# Patient Record
Sex: Male | Born: 2000 | Race: Black or African American | Hispanic: No | Marital: Single | Smoking: Never smoker
Health system: Southern US, Community
[De-identification: ages and names within clinical notes are randomized; demographics above are authoritative.]

## PROBLEM LIST (undated history)

## (undated) DIAGNOSIS — J45909 Unspecified asthma, uncomplicated: Secondary | ICD-10-CM

---
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
## 2021-01-21 ENCOUNTER — Other Ambulatory Visit: Payer: Self-pay

## 2021-01-21 ENCOUNTER — Emergency Department (HOSPITAL_COMMUNITY)
Admission: EM | Admit: 2021-01-21 | Discharge: 2021-01-22 | Disposition: A | Discharge status: Home / Self Care | Payer: Self-pay | Attending: Emergency Medicine | Admitting: Emergency Medicine

## 2021-01-21 ENCOUNTER — Encounter (HOSPITAL_COMMUNITY): Payer: Self-pay | Admitting: *Deleted

## 2021-01-21 DIAGNOSIS — R0602 Shortness of breath: Secondary | ICD-10-CM | POA: Insufficient documentation

## 2021-01-21 DIAGNOSIS — J45909 Unspecified asthma, uncomplicated: Secondary | ICD-10-CM | POA: Insufficient documentation

## 2021-01-21 DIAGNOSIS — Z7722 Contact with and (suspected) exposure to environmental tobacco smoke (acute) (chronic): Secondary | ICD-10-CM | POA: Insufficient documentation

## 2021-01-21 DIAGNOSIS — R079 Chest pain, unspecified: Secondary | ICD-10-CM | POA: Insufficient documentation

## 2021-01-21 NOTE — ED Triage Notes (Signed)
Pt c/o chest pain for 2 hours with some diff breathing
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
## 2021-01-22 ENCOUNTER — Emergency Department (HOSPITAL_COMMUNITY): Payer: Self-pay

## 2021-01-22 LAB — BASIC METABOLIC PANEL
Anion gap: 9 (ref 5–15)
BUN: 11 mg/dL (ref 6–20)
CO2: 25 mmol/L (ref 22–32)
Calcium: 9.5 mg/dL (ref 8.9–10.3)
Chloride: 102 mmol/L (ref 98–111)
Creatinine, Ser: 0.91 mg/dL (ref 0.61–1.24)
GFR, Estimated: >60 mL/min (ref >60)
Glucose, Bld: 114 mg/dL — ABNORMAL HIGH (ref 70–99)
Potassium: 3.4 mmol/L — ABNORMAL LOW (ref 3.5–5.1)
Sodium: 136 mmol/L (ref 135–145)

## 2021-01-22 LAB — CBC
HCT: 38.5 % — ABNORMAL LOW (ref 39.0–52.0)
Hemoglobin: 12.8 g/dL — ABNORMAL LOW (ref 13.0–17.0)
MCH: 29.9 pg (ref 26.0–34.0)
MCHC: 33.2 g/dL (ref 30.0–36.0)
MCV: 90 fL (ref 80.0–100.0)
Platelets: 345 10*3/uL (ref 150–400)
RBC: 4.28 MIL/uL (ref 4.22–5.81)
RDW: 11.8 % (ref 11.5–15.5)
WBC: 10.7 10*3/uL — ABNORMAL HIGH (ref 4.0–10.5)
nRBC: 0 % (ref 0.0–0.2)

## 2021-01-22 LAB — TROPONIN I (HIGH SENSITIVITY)
Troponin I (High Sensitivity): 3 ng/L (ref <18)
Troponin I (High Sensitivity): 4 ng/L (ref <18)

## 2021-01-22 NOTE — ED Provider Notes (Signed)
[facility] EMERGENCY DEPARTMENT Provider Note   CSN: [id number] Arrival date & time: 01/21/21  2320     History Chief Complaint  Patient presents with   Chest Pain    Curtis Potts is a 20 y.o. male.  HPI 20 year old male with history of asthma presents to the ER with complaints of chest pain and shortness of breath.  He states that he was laying in bed and started to feel chest tightness, and then started to feel short of breath.  Symptoms have resolved since being in the ER.  Has a history of asthma but states that this does not feel like his asthma.  Denies pleuritic symptoms, syncope, recent travel.    Past Medical History:  Diagnosis Date   Asthma     There are no problems to display for this patient.   History reviewed. No pertinent surgical history.     No family history on file.  Social History   Tobacco Use   Smoking status: Passive Smoke Exposure - Never Smoker   Smokeless tobacco: Never    Home Medications Prior to Admission medications   Medication Sig Start Date End Date Taking? Authorizing Provider  albuterol (PROVENTIL HFA;VENTOLIN HFA) 108 (90 BASE) MCG/ACT inhaler Inhale 2 puffs into the lungs every 6 (six) hours as needed. For shortness of breath    [provider]  Cetirizine HCl (ZYRTEC) 5 MG/5ML SYRP Take 5 mg by mouth daily as needed. For allergies    [provider]    Allergies    Patient has no known allergies.  Review of Systems   Review of Systems Ten systems reviewed and are negative for acute change, except as noted in the HPI.   Physical Exam Updated Vital Signs BP (!) 164/81   Pulse 95   Temp (!) 97.5 F (36.4 C)   Resp 11   Ht 5\' 8"  (1.727 m)   Wt 113.4 kg   SpO2 100%   BMI 38.01 kg/m   Physical Exam Vitals and nursing note reviewed.  Constitutional:      Appearance: He is well-developed. He is not ill-appearing or diaphoretic.  HENT:     Head: Normocephalic and  atraumatic.  Eyes:     Conjunctiva/sclera: Conjunctivae normal.  Cardiovascular:     Rate and Rhythm: Normal rate and regular rhythm.     Heart sounds: Normal heart sounds. No murmur heard. Pulmonary:     Effort: Pulmonary effort is normal. No respiratory distress.     Breath sounds: Normal breath sounds. No decreased breath sounds or wheezing.  Chest:     Chest wall: No tenderness.  Abdominal:     Palpations: Abdomen is soft.     Tenderness: There is no abdominal tenderness.  Musculoskeletal:        General: Normal range of motion.     Cervical back: Neck supple.     Right lower leg: No edema.     Left lower leg: No edema.  Skin:    General: Skin is warm and dry.  Neurological:     Mental Status: He is alert.    ED Results / Procedures / Treatments   Labs (all labs ordered are listed, but only abnormal results are displayed) Labs Reviewed  CBC - Abnormal; Notable for the following components:      Result Value   WBC 10.7 (*)    Hemoglobin 12.8 (*)    HCT 38.5 (*)    All other components within  normal limits  BASIC METABOLIC PANEL - Abnormal; Notable for the following components:   Potassium 3.4 (*)    Glucose, Bld 114 (*)    All other components within normal limits  TROPONIN I (HIGH SENSITIVITY)  TROPONIN I (HIGH SENSITIVITY)    EKG EKG Interpretation  Date/Time:  Friday January 21 2021 23:46:51 EDT Ventricular Rate:  75 PR Interval:  134 QRS Duration: 92 QT Interval:  348 QTC Calculation: 388 R Axis:   65 Text Interpretation: Sinus rhythm with sinus arrhythmia with occasional Premature ventricular complexes poor data quality limits interpretation. no obvious ischemia Confirmed by Pricilla Loveless [phone]) on 01/22/2021 3:05:25 AM  Radiology DG Chest 2 View  Result Date: 01/22/2021 CLINICAL DATA:  Chest pain. EXAM: CHEST - 2 VIEW COMPARISON:  August 01, 2008 FINDINGS: The heart size and mediastinal contours are within normal limits. Both lungs are clear. The  visualized skeletal structures are unremarkable. IMPRESSION: No active cardiopulmonary disease. Electronically Signed   By: Aram Candela M.D.   On: 01/22/2021 00:16    Procedures Procedures   Medications Ordered in ED Medications - No data to display  ED Course  I have reviewed the triage vital signs and the nursing notes.  Pertinent labs & imaging results that were available during my care of the patient were reviewed by me and considered in my medical decision making (see chart for details).    MDM Rules/Calculators/A&P                           20 year old presenting with chest pain and shortness of breath.  On arrival, he is well-appearing, no acute distress, resting comfortably in the ER bed.  No reproducible chest wall tenderness.  Lung sounds are clear.  Speaking full sentences without increased work of breathing, no wheezing.  Chest pain work-up overall benign, delta troponins negative, EKG nonischemic, chest x-ray without any evidence of pneumonia.  CBC and BMP largely unremarkable, borderline leukocytosis of 10.7.  Delta troponins negative. Suspect GERD versus anxiety.  Encouraged taking albuterol inhaler. Encouraged PCP followup. Discussed return precautions. Stable for discharge.  Final Clinical Impression(s) / ED Diagnoses Final diagnoses:  Chest pain, unspecified type    Rx / DC Orders ED Discharge Orders     None        Mare Ferrari, PA-C 01/22/21 9450    Pricilla Loveless, MD 01/22/21 [phone]

## 2021-01-22 NOTE — Discharge Instructions (Addendum)
You were evaluated in the Emergency Department and after careful evaluation, we did not find any emergent condition requiring admission or further testing in the hospital.  Please take your inhaler for your shortness of breath   Please return to the Emergency Department if you experience any worsening of your condition.  We encourage you to follow up with a primary care provider.  Thank you for allowing [place] to be a part of your care.

## 2021-07-22 ENCOUNTER — Ambulatory Visit (INDEPENDENT_AMBULATORY_CARE_PROVIDER_SITE_OTHER): Payer: Managed Care, Other (non HMO) | Admitting: Family Medicine

## 2021-07-22 ENCOUNTER — Encounter: Payer: Self-pay | Admitting: Family Medicine

## 2021-07-22 VITALS — BP 116/76 | HR 60 | Temp 97.8°F | Ht 66.5 in | Wt 227.0 lb

## 2021-07-22 DIAGNOSIS — K219 Gastro-esophageal reflux disease without esophagitis: Secondary | ICD-10-CM | POA: Diagnosis not present

## 2021-07-22 DIAGNOSIS — R21 Rash and other nonspecific skin eruption: Secondary | ICD-10-CM

## 2021-07-22 DIAGNOSIS — J302 Other seasonal allergic rhinitis: Secondary | ICD-10-CM

## 2021-07-22 DIAGNOSIS — J4520 Mild intermittent asthma, uncomplicated: Secondary | ICD-10-CM | POA: Diagnosis not present

## 2021-07-22 DIAGNOSIS — E669 Obesity, unspecified: Secondary | ICD-10-CM

## 2021-07-22 DIAGNOSIS — Z7689 Persons encountering health services in other specified circumstances: Secondary | ICD-10-CM

## 2021-07-22 MED ORDER — CETIRIZINE HCL 10 MG PO TABS: 10.0000 mg | ORAL_TABLET | Freq: Every day | ORAL | 5 refills | Status: AC

## 2021-07-22 MED ORDER — TRIAMCINOLONE ACETONIDE 0.1 % EX CREA: 1.0000 "application " | TOPICAL_CREAM | Freq: Two times a day (BID) | CUTANEOUS | 0 refills | Status: DC

## 2021-07-22 MED ORDER — FLUTICASONE PROPIONATE 50 MCG/ACT NA SUSP: 2.0000 | Freq: Every day | NASAL | 0 refills | Status: DC

## 2021-07-22 MED ORDER — FAMOTIDINE 20 MG PO TABS: 20.0000 mg | ORAL_TABLET | Freq: Two times a day (BID) | ORAL | 0 refills | Status: DC

## 2021-07-22 MED ORDER — ALBUTEROL SULFATE HFA 108 (90 BASE) MCG/ACT IN AERS: 2.0000 | INHALATION_SPRAY | Freq: Four times a day (QID) | RESPIRATORY_TRACT | 0 refills | Status: AC | PRN

## 2021-07-22 NOTE — Patient Instructions (Addendum)
Dermatology offices ? ?[facility]: Phone #: [phone] ?Address: [address], [place], [place] [address] ? ?[facility]: Phone: [phone] ? Address: [address], [place], [place] [address] ? ?[facility]: [phone] ?Address: [address] [place] [address] ?[place], [place] [address] ? ?[facility] ?Address: [address] [place], [place], [place] [address] ?Phone: [phone]  ? ? ?Food Choices for Gastroesophageal Reflux Disease, Adult ?When you have gastroesophageal reflux disease (GERD), the foods you eat and your eating habits are very important. Choosing the right foods can help ease your discomfort. Think about working with a food expert (dietitian) to help you make good choices. ?What are tips for following this plan? ?Reading food labels ?Look for foods that are low in saturated fat. Foods that may help with your symptoms include: ?Foods that have less than 5% of daily value (DV) of fat. ?Foods that have 0 grams of trans fat. ?Cooking ?Do not fry your food. ?Cook your food by baking, steaming, grilling, or broiling. These are all methods that do not need a lot of fat for cooking. ?To add flavor, try to use herbs that are low in spice and acidity. ?Meal planning ? ?Choose healthy foods that are low in fat, such as: ?Fruits and vegetables. ?Whole grains. ?Low-fat dairy products. ?Lean meats, fish, and poultry. ?Eat small meals often instead of eating 3 large meals each day. Eat your meals slowly in a place where you are relaxed. Avoid bending over or lying down until 2-3 hours after eating. ?Limit high-fat foods such as fatty meats or fried foods. ?Limit your intake of fatty foods, such as oils, butter, and shortening. ?Avoid the following as told by your doctor: ?Foods that cause symptoms. These may be different for different people. Keep a food diary to keep track of foods that cause symptoms. ?Alcohol. ?Drinking a lot of liquid with meals. ?Eating meals during  the 2-3 hours before bed. ?Lifestyle ?Stay at a healthy weight. Ask your doctor what weight is healthy for you. If you need to lose weight, work with your doctor to do so safely. ?Exercise for at least 30 minutes on 5 or more days each week, or as told by your doctor. ?Wear loose-fitting clothes. ?Do not smoke or use any products that contain nicotine or tobacco. If you need help quitting, ask your doctor. ?Sleep with the head of your bed higher than your feet. Use a wedge under the mattress or blocks under the bed frame to raise the head of the bed. ?Chew sugar-free gum after meals. ?What foods should eat? ?Eat a healthy, well-balanced diet of fruits, vegetables, whole grains, low-fat dairy products, lean meats, fish, and poultry. Each person is different. ?Foods that may cause symptoms in one person may not cause any symptoms in another person. Work with your doctor to find foods that are safe for you. ?The items listed above may not be a complete list of what you can eat and drink. Contact a food expert for more options. ?What foods should I avoid? ?Limiting some of these foods may help in managing the symptoms of GERD. Everyone is different. Talk with a food expert or your doctor to help you find the exact foods to avoid, if any. ?Fruits ?Any fruits prepared with added fat. Any fruits that cause symptoms. For some people, this may include citrus fruits, such as oranges, grapefruit, pineapple, and lemons. ?Vegetables ?Deep-fried vegetables. [place] fries. Any vegetables prepared with added fat. Any vegetables that cause symptoms. For some people, this  may include tomatoes and tomato products, chili peppers, onions and garlic, and horseradish. ?Grains ?Pastries or quick breads with added fat. ?Meats and other proteins ?High-fat meats, such as fatty beef or pork, hot dogs, ribs, ham, sausage, salami, and bacon. Fried meat or protein, including fried fish and fried chicken. Nuts and nut butters, in large  amounts. ?Dairy ?Whole milk and chocolate milk. Sour cream. Cream. Ice cream. Cream cheese. Milkshakes. ?Fats and oils ?Butter. Margarine. Shortening. Ghee. ?Beverages ?Coffee and tea, with or without caffeine. Carbonated beverages. Sodas. Energy drinks. Fruit juice made with acidic fruits, such as orange or grapefruit. Tomato juice. Alcoholic drinks. ?Sweets and desserts ?Chocolate and cocoa. Donuts. ?Seasonings and condiments ?Pepper. Peppermint and spearmint. Added salt. Any condiments, herbs, or seasonings that cause symptoms. For some people, this may include curry, hot sauce, or vinegar-based salad dressings. ?The items listed above may not be a complete list of what you should not eat and drink. Contact a food expert for more options. ?Questions to ask your doctor ?Diet and lifestyle changes are often the first steps that are taken to manage symptoms of GERD. If diet and lifestyle changes do not help, talk with your doctor about taking medicines. ?Where to find more information ?International Foundation for Gastrointestinal Disorders: aboutgerd.org ?Summary ?When you have GERD, food and lifestyle choices are very important in easing your symptoms. ?Eat small meals often instead of 3 large meals a day. Eat your meals slowly and in a place where you are relaxed. ?Avoid bending over or lying down until 2-3 hours after eating. ?Limit high-fat foods such as fatty meats or fried foods. ?This information is not intended to replace advice given to you by your health care provider. Make sure you discuss any questions you have with your health care provider. ?Document Revised: 10/20/2019 Document Reviewed: 10/20/2019 ?Elsevier Patient Education ? 2022 Elsevier Inc. ? ?

## 2021-07-22 NOTE — Progress Notes (Signed)
? ?Subjective:  ? ? Patient ID: Curtis Potts, male    DOB: 2001/01/22, 21 y.o.   MRN: [id number] ? ?HPI ?Chief Complaint  ?Patient presents with  ? Establish Care  ?  Would like to discuss his allergies, needs new medication for zyrtec. GERD has been messing with him a lot which he would like to discuss as well.   ? ?He is new to the practice and here to establish care.  ?Previous medical care: ?[facility]  ? ?States his allergies are acting up. Seasonal allergies. Sneezing, nasal congestion, rhinorrhea.  ?Normally he takes Zyrtec but is out and did not realize he could purchase it OTC.  ? ?Asthma related to exercise.  ?Needs refill of albuterol. No recent flare ups.  ?Has not been exercising.  ? ?States he is gaining weight and wants to exercise.  ? ?Rash on his bilateral upper arms for years. Pruritic and would like to get bumps removed. Has not seen derm.  ?Denies rash elsewhere.  ? ?No arthralgias or myalgias.  ? ?Denies fever, chills, dizziness, chest pain, palpitations, shortness of breath, abdominal pain, N/V/D, or urinary symptoms.  ? ? ?Social history: Lives with grandmother ?Denies smoking, drinking alcohol, drug use ? ? ? ?Reviewed allergies, medications, past medical, surgical, family, and social history. ? ? ? ? ?Review of Systems ?Pertinent positives and negatives in the history of present illness. ? ?   ?Objective:  ? Physical Exam ?Constitutional:   ?   General: He is not in acute distress. ?   Appearance: Normal appearance. He is not ill-appearing.  ?HENT:  ?   Nose: Congestion and rhinorrhea present.  ?Eyes:  ?   Conjunctiva/sclera: Conjunctivae normal.  ?   Pupils: Pupils are equal, round, and reactive to light.  ?Cardiovascular:  ?   Rate and Rhythm: Normal rate and regular rhythm.  ?   Pulses: Normal pulses.  ?Pulmonary:  ?   Effort: Pulmonary effort is normal.  ?   Breath sounds: Normal breath sounds.  ?Musculoskeletal:  ?   Cervical back: Normal range of motion and neck  supple.  ?Skin: ?   General: Skin is warm and dry.  ?   Findings: Rash present.  ?   Comments: Small, pinpoint, raised pink lesions on bilateral lateral upper arms. No surrounding erythema or drainage.   ?Neurological:  ?   General: No focal deficit present.  ?   Mental Status: He is alert and oriented to person, place, and time.  ?Psychiatric:     ?   Mood and Affect: Mood normal.     ?   Behavior: Behavior normal.  ? ?BP 116/76 (BP Location: Left Arm, Patient Position: Sitting, Cuff Size: Large)   Pulse 60   Temp 97.8 ?F (36.6 ?C) (Temporal)   Ht 5' 6.5" (1.689 m)   Wt 227 lb (103 kg)   SpO2 97%   BMI 36.09 kg/m?  ? ? ? ? ?   ?Assessment & Plan:  ?Gastroesophageal reflux disease, unspecified whether esophagitis present - Plan: famotidine (PEPCID) 20 MG tablet ?-Counseling done on lifestyle modifications for GERD management.  He may try Pepcid 1-2 times per day for the next month to see if that helps.  Discussed avoiding triggers. ? ?Encounter to establish care ? ?Mild intermittent asthma without complication - Plan: albuterol (VENTOLIN HFA) 108 (90 Base) MCG/ACT inhaler ?-no recent flares. Albuterol refilled.  Counseling on how to premedicate with albuterol prior to exercise since his fear of  an asthma flareup has kept him from exercising recently. ? ?Obesity (BMI 30-39.9) ?-Encouraged cutting back on carbohydrates and calories and increasing physical activity. ? ?Seasonal allergies - Plan: cetirizine (ZYRTEC) 10 MG tablet, fluticasone (FLONASE) 50 MCG/ACT nasal spray ?-He will start back on Zyrtec.  Discussed that if he runs out of his prescription or if it is cheaper to buy over-the-counter that he may do so.  Discussed trying Flonase for nasal congestion.  Follow-up if not improving or if getting worse. ? ?Rash - Plan: triamcinolone cream (KENALOG) 0.1 % ?-He will try a topical steroid for pruritus for 1-2 weeks. No sign of inflammatory process.   He will call and schedule a dermatologist ? ?

## 2021-08-18 ENCOUNTER — Other Ambulatory Visit: Payer: Self-pay | Admitting: Family Medicine

## 2021-08-18 DIAGNOSIS — K219 Gastro-esophageal reflux disease without esophagitis: Secondary | ICD-10-CM

## 2021-08-18 DIAGNOSIS — J302 Other seasonal allergic rhinitis: Secondary | ICD-10-CM

## 2022-11-06 ENCOUNTER — Telehealth: Payer: Self-pay

## 2022-11-06 NOTE — Telephone Encounter (Signed)
LVM for patient to call back [phone], or to call PCP office to schedule follow up apt. AS, CMA  

## 2023-04-05 IMAGING — CR DG CHEST 2V
2 series · 2 of 2 positions shown · non-contrast
Comparison: August 01, 2008

CLINICAL DATA: Chest pain.

EXAM:
CHEST - 2 VIEW

[chest pa]
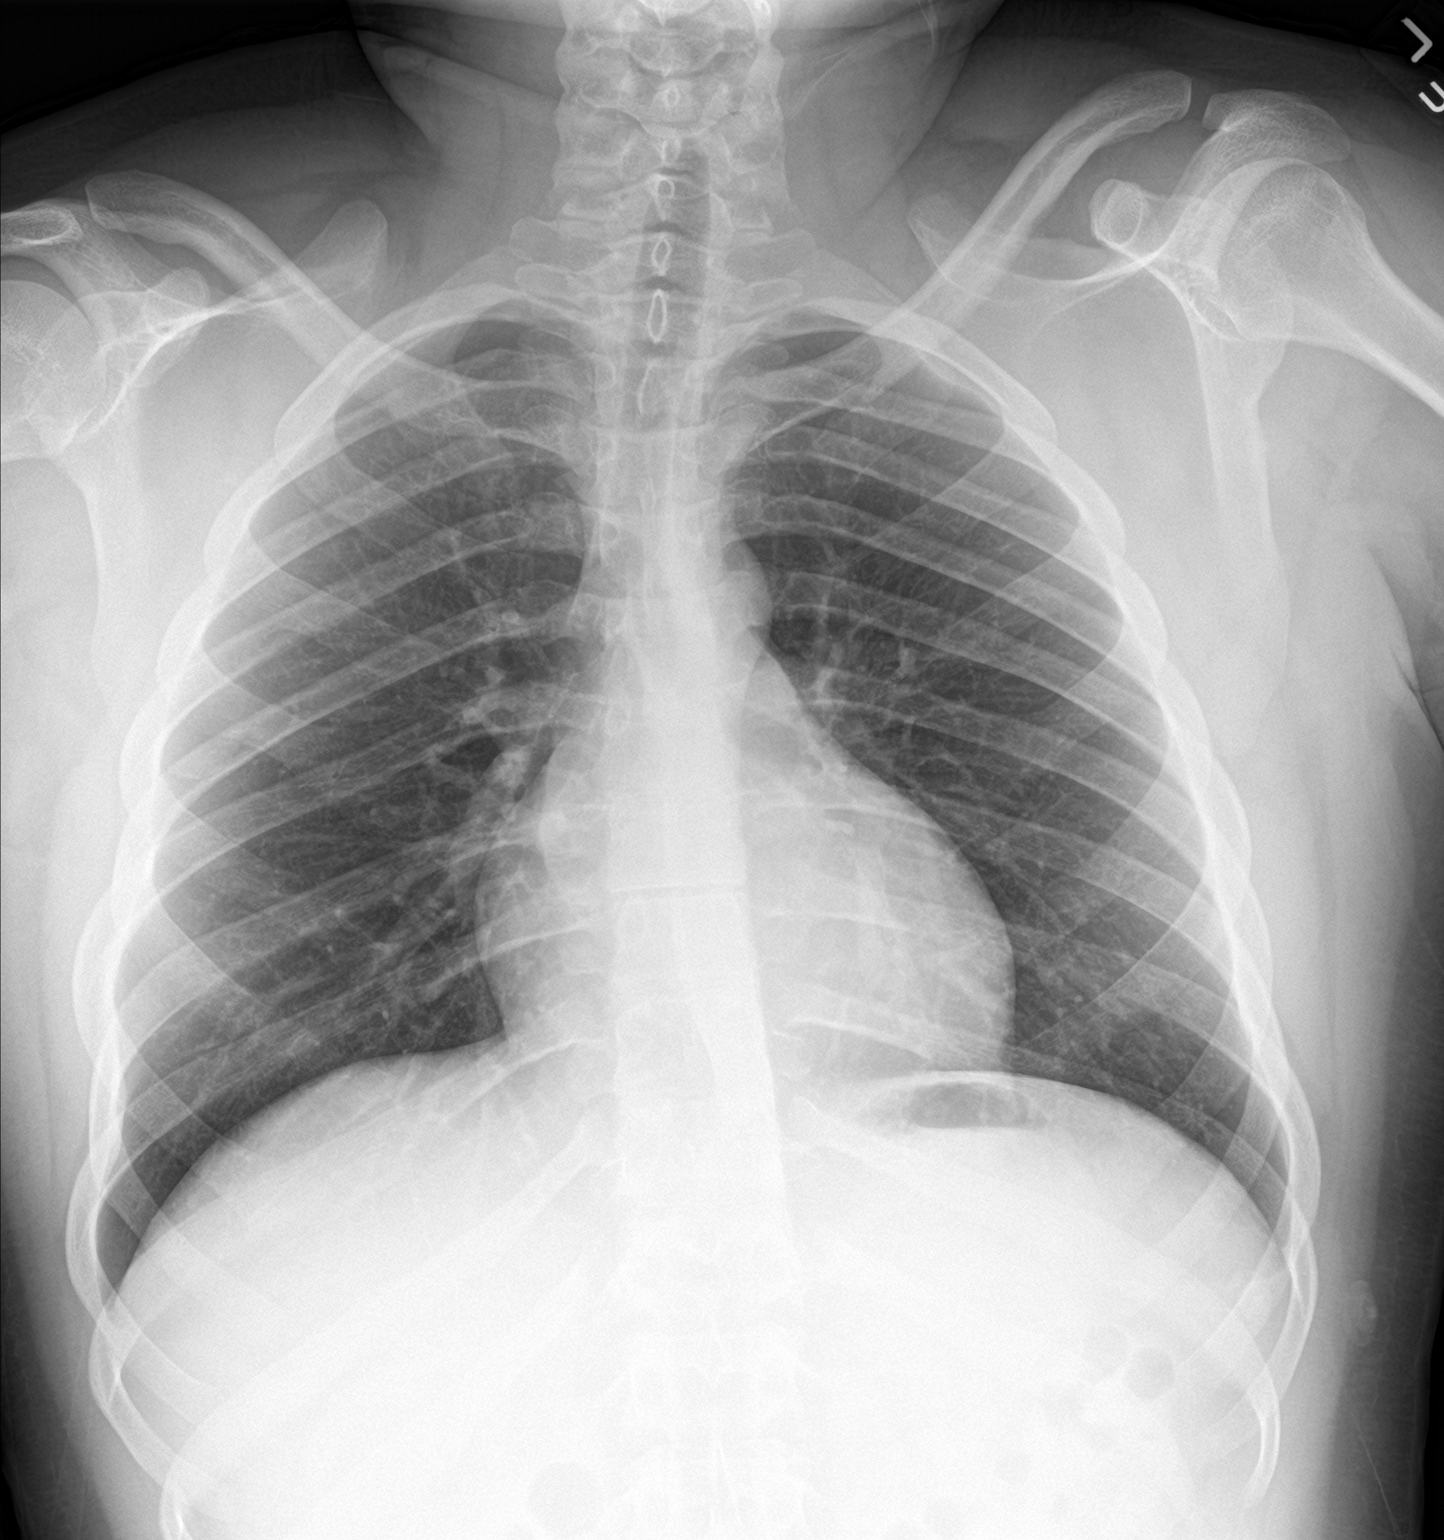

[chest lat]
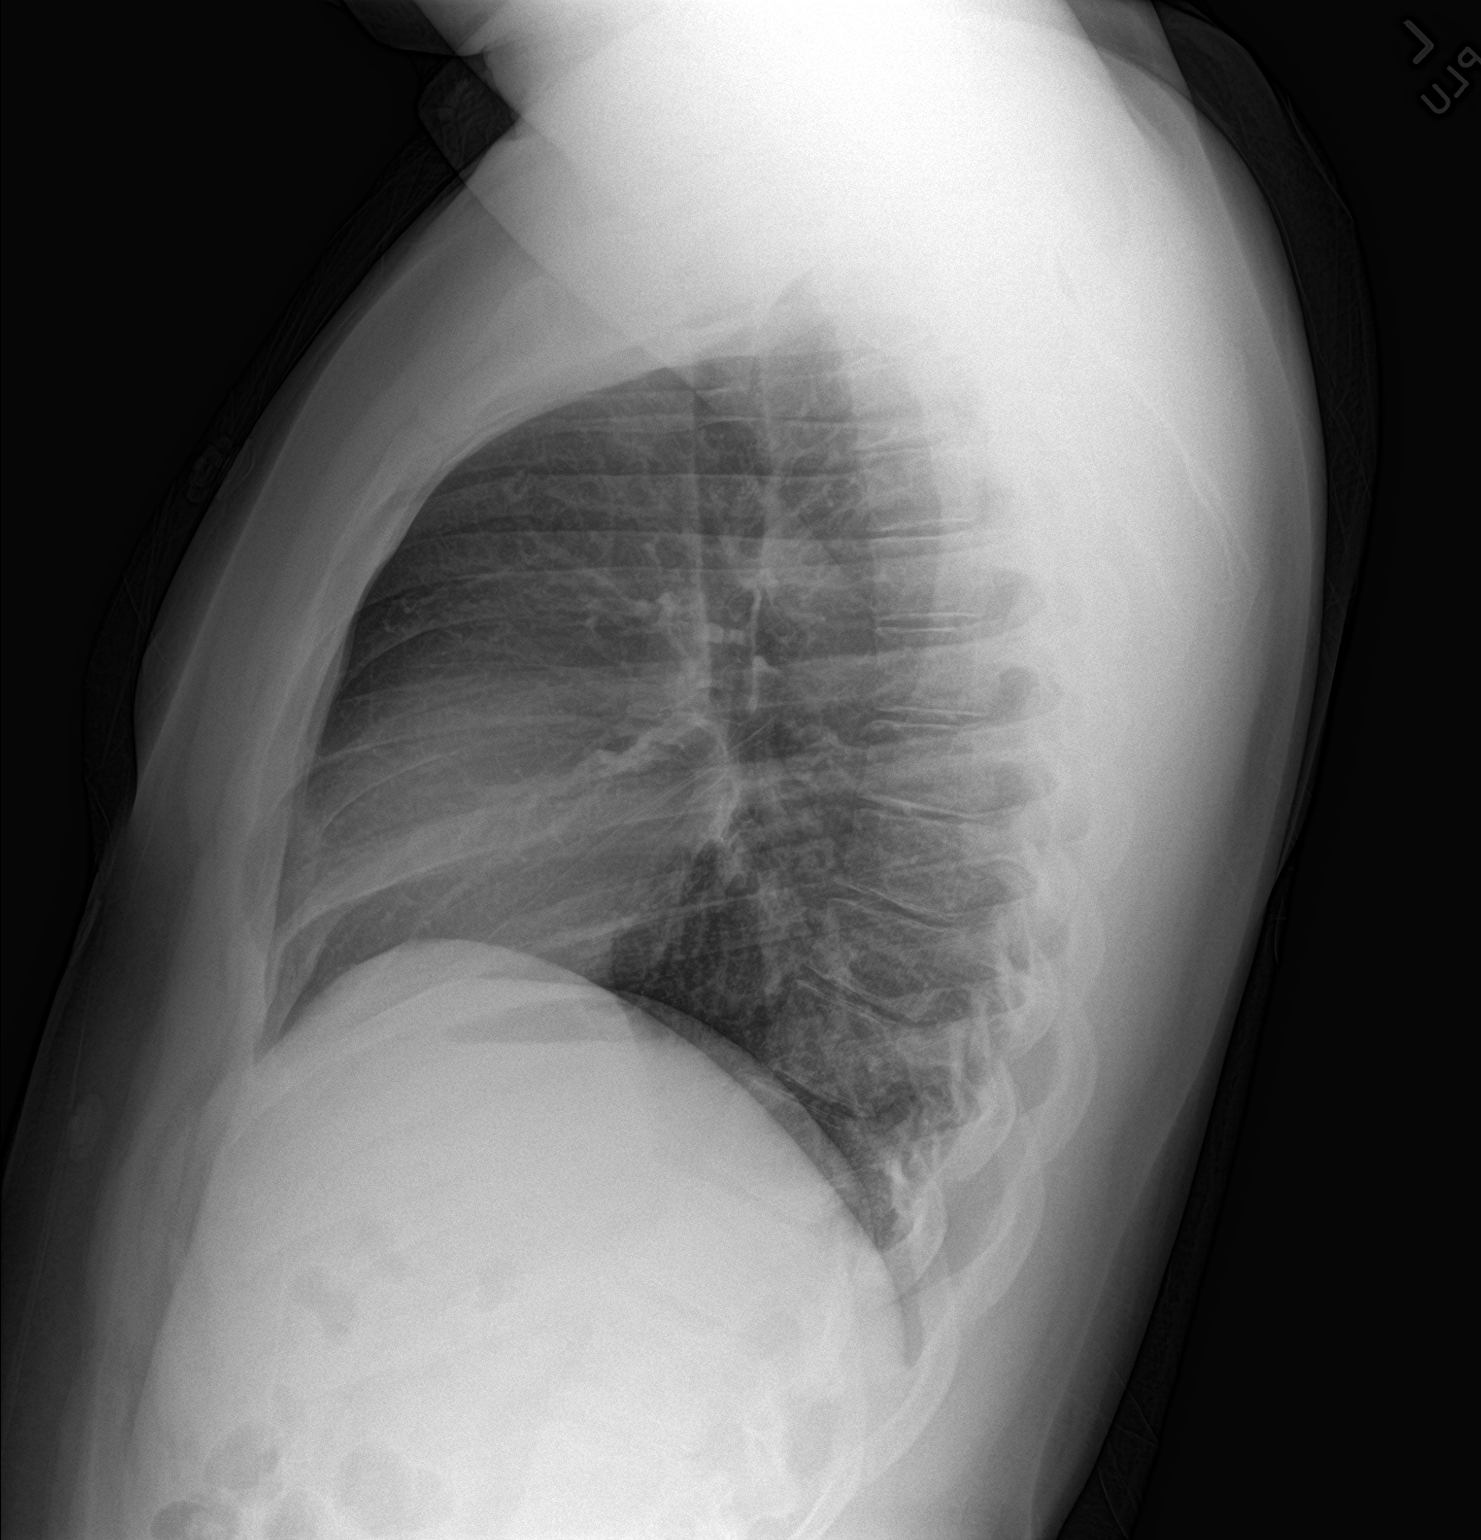

[2 of 2 positions shown; findings below may reference images not displayed]

FINDINGS: The heart size and mediastinal contours are within normal limits.
Both lungs are clear. The visualized skeletal structures are
unremarkable.
IMPRESSION: No active cardiopulmonary disease.

## 2024-01-09 ENCOUNTER — Other Ambulatory Visit: Payer: Self-pay

## 2024-01-09 ENCOUNTER — Emergency Department (HOSPITAL_COMMUNITY)
Admission: EM | Admit: 2024-01-09 | Discharge: 2024-01-10 | Disposition: A | Discharge status: Home / Self Care | Payer: MEDICAID | Attending: Emergency Medicine | Admitting: Emergency Medicine

## 2024-01-09 ENCOUNTER — Emergency Department (HOSPITAL_COMMUNITY): Payer: MEDICAID

## 2024-01-09 DIAGNOSIS — R1013 Epigastric pain: Secondary | ICD-10-CM | POA: Insufficient documentation

## 2024-01-09 DIAGNOSIS — D72829 Elevated white blood cell count, unspecified: Secondary | ICD-10-CM | POA: Insufficient documentation

## 2024-01-09 DIAGNOSIS — R42 Dizziness and giddiness: Secondary | ICD-10-CM | POA: Diagnosis not present

## 2024-01-09 DIAGNOSIS — Z7951 Long term (current) use of inhaled steroids: Secondary | ICD-10-CM | POA: Diagnosis not present

## 2024-01-09 DIAGNOSIS — K219 Gastro-esophageal reflux disease without esophagitis: Secondary | ICD-10-CM

## 2024-01-09 DIAGNOSIS — J45909 Unspecified asthma, uncomplicated: Secondary | ICD-10-CM | POA: Insufficient documentation

## 2024-01-09 DIAGNOSIS — R0602 Shortness of breath: Secondary | ICD-10-CM | POA: Insufficient documentation

## 2024-01-09 LAB — COMPREHENSIVE METABOLIC PANEL WITH GFR
ALT: 19 U/L (ref 0–44)
AST: 21 U/L (ref 15–41)
Albumin: 4.1 g/dL (ref 3.5–5.0)
Alkaline Phosphatase: 30 U/L — ABNORMAL LOW (ref 38–126)
Anion gap: 13 (ref 5–15)
BUN: 13 mg/dL (ref 6–20)
CO2: 23 mmol/L (ref 22–32)
Calcium: 9.7 mg/dL (ref 8.9–10.3)
Chloride: 103 mmol/L (ref 98–111)
Creatinine, Ser: 1.09 mg/dL (ref 0.61–1.24)
GFR, Estimated: >60 mL/min (ref >60)
Glucose, Bld: 96 mg/dL (ref 70–99)
Potassium: 4.2 mmol/L (ref 3.5–5.1)
Sodium: 139 mmol/L (ref 135–145)
Total Bilirubin: 1.5 mg/dL — ABNORMAL HIGH (ref 0.0–1.2)
Total Protein: 7.6 g/dL (ref 6.5–8.1)

## 2024-01-09 LAB — I-STAT CHEM 8, ED
BUN: 15 mg/dL (ref 6–20)
Calcium, Ion: 1.21 mmol/L (ref 1.15–1.40)
Chloride: 103 mmol/L (ref 98–111)
Creatinine, Ser: 1 mg/dL (ref 0.61–1.24)
Glucose, Bld: 101 mg/dL — ABNORMAL HIGH (ref 70–99)
HCT: 41 % (ref 39.0–52.0)
Hemoglobin: 13.9 g/dL (ref 13.0–17.0)
Potassium: 4.2 mmol/L (ref 3.5–5.1)
Sodium: 139 mmol/L (ref 135–145)
TCO2: 24 mmol/L (ref 22–32)

## 2024-01-09 LAB — CBC WITH DIFFERENTIAL/PLATELET
Abs Immature Granulocytes: 0.05 K/uL (ref 0.00–0.07)
Basophils Absolute: 0.1 K/uL (ref 0.0–0.1)
Basophils Relative: 0 %
Eosinophils Absolute: 0.1 K/uL (ref 0.0–0.5)
Eosinophils Relative: 0 %
HCT: 41.9 % (ref 39.0–52.0)
Hemoglobin: 14 g/dL (ref 13.0–17.0)
Immature Granulocytes: 0 %
Lymphocytes Relative: 14 %
Lymphs Abs: 2.3 K/uL (ref 0.7–4.0)
MCH: 30.4 pg (ref 26.0–34.0)
MCHC: 33.4 g/dL (ref 30.0–36.0)
MCV: 91.1 fL (ref 80.0–100.0)
Monocytes Absolute: 0.8 K/uL (ref 0.1–1.0)
Monocytes Relative: 5 %
Neutro Abs: 12.5 K/uL — ABNORMAL HIGH (ref 1.7–7.7)
Neutrophils Relative %: 81 %
Platelets: 370 K/uL (ref 150–400)
RBC: 4.6 MIL/uL (ref 4.22–5.81)
RDW: 11.9 % (ref 11.5–15.5)
WBC: 15.7 K/uL — ABNORMAL HIGH (ref 4.0–10.5)
nRBC: 0 % (ref 0.0–0.2)

## 2024-01-09 LAB — URINALYSIS, ROUTINE W REFLEX MICROSCOPIC
Bacteria, UA: NONE SEEN
Bilirubin Urine: NEGATIVE
Glucose, UA: NEGATIVE mg/dL
Hgb urine dipstick: NEGATIVE
Ketones, ur: NEGATIVE mg/dL
Leukocytes,Ua: NEGATIVE
Nitrite: NEGATIVE
Protein, ur: NEGATIVE mg/dL
Specific Gravity, Urine: 1.028 (ref 1.005–1.030)
pH: 5 (ref 5.0–8.0)

## 2024-01-09 LAB — LIPASE, BLOOD: Lipase: 28 U/L (ref 11–51)

## 2024-01-09 MED ORDER — DICYCLOMINE HCL 10 MG PO CAPS
10.0000 mg | ORAL_CAPSULE | Freq: Once | ORAL | Status: AC
  Administered 2024-01-09: 10 mg via ORAL
  Filled 2024-01-09: qty 1

## 2024-01-09 MED ORDER — ALUM & MAG HYDROXIDE-SIMETH 200-200-20 MG/5ML PO SUSP
30.0000 mL | Freq: Once | ORAL | Status: AC
  Administered 2024-01-09: 30 mL via ORAL
  Filled 2024-01-09: qty 30

## 2024-01-09 MED ORDER — PANTOPRAZOLE SODIUM 20 MG PO TBEC
20.0000 mg | DELAYED_RELEASE_TABLET | Freq: Once | ORAL | Status: AC
  Administered 2024-01-09: 20 mg via ORAL
  Filled 2024-01-09: qty 1

## 2024-01-09 NOTE — ED Provider Notes (Signed)
 [place] EMERGENCY DEPARTMENT AT [facility] Provider Note   CSN: [id number] Arrival date & time: 01/09/24  1311     Patient presents with: Abdominal Pain   Curtis Potts is a 23 y.o. male.    Abdominal Pain Associated symptoms: chest pain and shortness of breath   Patient is a 23 year old male presented ED today for concerns for epigastric abdominal pain that radiated into his sternal area, was accompanied with an episode of lightheadedness and shortness of breath that lasted approximately 20 minutes earlier today.  States that afterward he did have a a small headache that lasted for a few minutes but then went away.  Noted that this episode happened after he got up suddenly while walking to the bathroom.  Previous history of GERD, asthma.  Does not take any current medication at this time.  Reported that he did had a family history of early heart disease, with uncle dying an area at age of 108 secondary to heart disease.  Denies fever, worsening blurry vision, vertigo, tinnitus, dysphagia, odynophagia, congestion, nausea, vomiting, diarrhea, melena, hematochezia, dysuria, lower leg swelling.    Prior to Admission medications   Medication Sig Start Date End Date Taking? Authorizing Provider  famotidine  (PEPCID ) 20 MG tablet Take 1 tablet (20 mg total) by mouth 2 (two) times daily. 01/10/24  Yes Curtis Terrall RAMAN, PA-C  pantoprazole  (PROTONIX ) 20 MG tablet Take 1 tablet (20 mg total) by mouth daily. 01/10/24  Yes Cisco Kindt S, PA-C  albuterol  (VENTOLIN  HFA) 108 (90 Base) MCG/ACT inhaler Inhale 2 puffs into the lungs every 6 (six) hours as needed for wheezing or shortness of breath. 07/22/21   Henson, Vickie L, NP-C  cetirizine  (ZYRTEC ) 10 MG tablet Take 1 tablet (10 mg total) by mouth daily. 07/22/21   Henson, Vickie L, NP-C  famotidine  (PEPCID ) 20 MG tablet TAKE 1 TABLET(20 MG) BY MOUTH TWICE DAILY 08/18/21   Henson, Vickie L, NP-C  fluticasone  (FLONASE ) 50 MCG/ACT  nasal spray SHAKE LIQUID AND USE 2 SPRAYS IN EACH NOSTRIL DAILY 08/18/21   Henson, Vickie L, NP-C  triamcinolone  cream (KENALOG ) 0.1 % Apply 1 application. topically 2 (two) times daily. Use for 10-14 days and stop. 07/22/21   Lendia Boby CROME, NP-C    Allergies: Patient has no known allergies.    Review of Systems  Respiratory:  Positive for shortness of breath.   Cardiovascular:  Positive for chest pain.  Gastrointestinal:  Positive for abdominal pain.  Neurological:  Positive for dizziness.  All other systems reviewed and are negative.   Updated Vital Signs BP 135/77 (BP Location: Right Arm)   Pulse (!) 52   Temp 97.7 F (36.5 C) (Oral)   Resp 15   SpO2 100%   Physical Exam Vitals and nursing note reviewed.  Constitutional:      General: He is not in acute distress.    Appearance: Normal appearance. He is not ill-appearing or diaphoretic.  HENT:     Head: Normocephalic and atraumatic.  Eyes:     General: No scleral icterus.       Right eye: No discharge.        Left eye: No discharge.     Extraocular Movements: Extraocular movements intact.     Conjunctiva/sclera: Conjunctivae normal.  Cardiovascular:     Rate and Rhythm: Normal rate and regular rhythm.     Pulses: Normal pulses.     Heart sounds: Normal heart sounds. No murmur heard.    No friction  rub. No gallop.  Pulmonary:     Effort: Pulmonary effort is normal. No respiratory distress.     Breath sounds: No stridor. No wheezing, rhonchi or rales.  Chest:     Chest wall: No tenderness.  Abdominal:     General: Abdomen is flat. There is no distension.     Palpations: Abdomen is soft.     Tenderness: There is abdominal tenderness (Mild epigastric tenderness noted to palpation.). There is no right CVA tenderness, left CVA tenderness, guarding or rebound.  Musculoskeletal:        General: No swelling, deformity or signs of injury.     Cervical back: Normal range of motion. No rigidity.     Right lower leg: No  edema.     Left lower leg: No edema.  Skin:    General: Skin is warm and dry.     Findings: No bruising, erythema or lesion.  Neurological:     General: No focal deficit present.     Mental Status: He is alert and oriented to person, place, and time. Mental status is at baseline.     Cranial Nerves: No cranial nerve deficit.     Sensory: No sensory deficit.     Motor: No weakness.  Psychiatric:        Mood and Affect: Mood normal.     (all labs ordered are listed, but only abnormal results are displayed) Labs Reviewed  CBC WITH DIFFERENTIAL/PLATELET - Abnormal; Notable for the following components:      Result Value   WBC 15.7 (*)    Neutro Abs 12.5 (*)    All other components within normal limits  COMPREHENSIVE METABOLIC PANEL WITH GFR - Abnormal; Notable for the following components:   Alkaline Phosphatase 30 (*)    Total Bilirubin 1.5 (*)    All other components within normal limits  URINALYSIS, ROUTINE W REFLEX MICROSCOPIC - Abnormal; Notable for the following components:   APPearance HAZY (*)    All other components within normal limits  I-STAT CHEM 8, ED - Abnormal; Notable for the following components:   Glucose, Bld 101 (*)    All other components within normal limits  LIPASE, BLOOD    EKG: None  Radiology: DG Chest 2 View Result Date: 01/09/2024 CLINICAL DATA:  episode of chest pain and shortness of breath EXAM: CHEST - 2 VIEW COMPARISON:  chest x-ray 01/22/2021 . FINDINGS: The heart and mediastinal contours are within normal limits. No focal consolidation. No pulmonary edema. No pleural effusion. No pneumothorax. No acute osseous abnormality. IMPRESSION: No active cardiopulmonary disease. Electronically Signed   By: Morgane  Naveau M.D.   On: 01/09/2024 22:43    Procedures   Medications Ordered in the ED  alum & mag hydroxide-simeth (MAALOX/MYLANTA) 200-200-20 MG/5ML suspension 30 mL (30 mLs Oral Given 01/09/24 2352)  dicyclomine  (BENTYL ) capsule 10 mg (10 mg  Oral Given 01/09/24 2351)  pantoprazole  (PROTONIX ) EC tablet 20 mg (20 mg Oral Given 01/09/24 2351)      Medical Decision Making Amount and/or Complexity of Data Reviewed Radiology: ordered. ECG/medicine tests: ordered.  Risk OTC drugs. Prescription drug management.   This patient is a 23 year old male who presents to the ED for concern of epigastric abdominal pain and some lightheadedness that happened after he got up suddenly that lasted for approximately 10 to 20 minutes.  Currently only experiencing a mild epigastric abdominal pain but otherwise is not having any other symptoms at this time.  On physical exam, patient is  in no acute distress, afebrile, alert and orient x 4, speaking in full sentences, nontachypneic, nontachycardic.  LCTAB, RRR, no murmur, no lower leg edema, mild epigastric tenderness noted to palpation.  No CVA tenderness.  With reassuring physical exam, labs were done showing a leukocytosis of 15.2 as well as a mildly elevated bilirubin.  Patient well-appearing and told him that with his lightheadedness episode recommended troponin and more heart workup with his family history of early heart disease and death.  However he declines at this time.  Recommended that if he had any recurrence to immediate come to the ED for further evaluation.  Also recommended he follow-up with PCP to establish care.   Represcribed his GERD medication, with noting improvement after provided today.  Currently not having any epigastric pain at this time.  Patient vital signs have remained stable throughout the course of patient's time in the ED. Low suspicion for any other emergent pathology at this time. I believe this patient is safe to be discharged. Provided strict return to ER precautions. Patient expressed agreement and understanding of plan. All questions were answered.  Differential diagnoses prior to evaluation: The emergent differential diagnosis includes, but is not limited to,  ACS, AAS, Pulmonary Embolism, Tension Pneumothorax, Esophageal Rupture, Cardiac Tamponade, Pericarditis, Myocarditis, Pneumothorax, Pneumonia, Aortic Stenosis, CHF Exacerbation, GERD,  Esophageal Spasm,  Mallory-Weiss, Costochondritis, Musculoskeletal Chest Wall Pain, Anxiety / Panic Attack, BPPV, vestibular migraine, head trauma, AVM, intracranial tumor, multiple sclerosis, drug-related, CVA, orthostatic hypotension, sepsis, hypoglycemia, electrolyte disturbance, anemia, anxiety. This is not an exhaustive differential.   Past Medical History / Co-morbidities / Social History: GERD, asthma  Additional history: Chart reviewed. Pertinent results include:   Last seen by PCP on 07/12/2021.  Lab Tests/Imaging studies: I personally interpreted labs/imaging and the pertinent results include:    CBC notes an elevated leukocytosis of 15.7 CMP notes an elevated bilirubin of 1.5 Lipase unremarkable. UA unremarkable   Chest x-ray unremarkable Troponin unremarkable I agree with the radiologist interpretation.  Cardiac monitoring: EKG obtained and interpreted by myself and attending physician which shows: Sinus bradycardia   Medications: I ordered medication including Protonix , Bentyl , Maalox/Mylanta.  I have reviewed the patients home medicines and have made adjustments as needed.  Critical Interventions: None  Social Determinants of Health: Does not currently have PCP that he sees regularly  Disposition: After consideration of the diagnostic results and the patients response to treatment, I feel that the patient would benefit from discharge and treatment as above.   emergency department workup does not suggest an emergent condition requiring admission or immediate intervention beyond what has been performed at this time. The plan is: Represcribed GERD medication, follow-up with PCP, return for new or worsening symptoms. The patient is safe for discharge and has been instructed to return  immediately for worsening symptoms, change in symptoms or any other concerns.    Final diagnoses:  Epigastric pain  Lightheadedness    ED Discharge Orders          Ordered    pantoprazole  (PROTONIX ) 20 MG tablet  Daily        01/10/24 0041    famotidine  (PEPCID ) 20 MG tablet  2 times daily        01/10/24 0041               Curtis Terrall RAMAN, PA-C 01/10/24 0044    Theadore Ozell HERO, MD 01/10/24 [id number]

## 2024-01-09 NOTE — ED Triage Notes (Signed)
 Pt having abdominal pain, lightheaded, dizziness, since 11am. Pt felt like he needed to use the bathroom but was unable to go. Pt still feeling abdominal pain at this time but all other symptoms resolved.

## 2024-01-09 NOTE — ED Provider Triage Note (Signed)
 Emergency Medicine Provider Triage Evaluation Note  Curtis Potts , a 23 y.o. male  was evaluated in triage.  Pt complains of stomach pain. Had phone call with Curtis Potts, then didn't feel well- general headache and felt light headed and needed to go to the bathroom. Was ambulatory without difficulty. Did not end up using the bathroom. Symptoms largely improved, does still have epigastric pain. No other complaints at this time.   Review of Systems  Positive:  Negative:   Physical Exam  BP 107/73   Pulse 73   Temp 97.7 F (36.5 C)   Resp 14   SpO2 98%  Gen:   Awake, no distress   Resp:  Normal effort  MSK:   Moves extremities without difficulty  Other:  Abd soft and non tender. Neuro exam grossly normal.   Medical Decision Making  Medically screening exam initiated at 1:32 PM.  Appropriate orders placed.  Curtis Potts was informed that the remainder of the evaluation will be completed by another provider, this initial triage assessment does not replace that evaluation, and the importance of remaining in the ED until their evaluation is complete.     Curtis Potts LABOR, PA-C 01/09/24 1334

## 2024-01-10 MED ORDER — FAMOTIDINE 20 MG PO TABS
20.0000 mg | ORAL_TABLET | Freq: Two times a day (BID) | ORAL | 0 refills | Status: AC
Start: 2024-01-10 — End: ?

## 2024-01-10 MED ORDER — PANTOPRAZOLE SODIUM 20 MG PO TBEC
20.0000 mg | DELAYED_RELEASE_TABLET | Freq: Every day | ORAL | 0 refills | Status: AC
Start: 2024-01-10 — End: ?

## 2024-01-10 NOTE — ED Notes (Signed)
 Pt stated that he was told the trop was optional and he really hurt the last time he got stuck and really doesn't want to do it again, he said he would like to refuse the test

## 2024-01-10 NOTE — Discharge Instructions (Signed)
 You are seen today for upper abdominal pain and some lightheadedness that you experience today.  Your EKG today and labs were reassuring.  I did note an elevated white blood cell count which could be secondary to infection or inflammation, suspect symptoms today are likely from a gastritis.  However if you begin to have any recurrence of the chest pain or shortness of breath, please return to the ED for further evaluation as  the labs that you deferred today will need to be done at that time. Recommend continue to follow-up and schedule an appointment with a PCP.  I have 1 listed here that you can call and schedule an appointment however you could also call your family's PCP and get an appointment with them as well.  I have represcribed the medications that helped today with your symptoms.

## 2024-01-17 ENCOUNTER — Encounter: Payer: Self-pay | Admitting: Emergency Medicine

## 2024-01-17 ENCOUNTER — Ambulatory Visit (INDEPENDENT_AMBULATORY_CARE_PROVIDER_SITE_OTHER): Payer: MEDICAID | Admitting: Emergency Medicine

## 2024-01-17 VITALS — BP 120/100 | HR 53 | Temp 97.7°F | Ht 66.5 in | Wt 251.0 lb

## 2024-01-17 DIAGNOSIS — R21 Rash and other nonspecific skin eruption: Secondary | ICD-10-CM

## 2024-01-17 DIAGNOSIS — R1013 Epigastric pain: Secondary | ICD-10-CM | POA: Diagnosis not present

## 2024-01-17 MED ORDER — TRIAMCINOLONE ACETONIDE 0.1 % EX CREA: 1.0000 | TOPICAL_CREAM | Freq: Two times a day (BID) | CUTANEOUS | 0 refills | Status: AC

## 2024-01-17 NOTE — Progress Notes (Signed)
 Curtis Potts 23 y.o.   Chief Complaint  Patient presents with   Follow-up    Pt states that he has gotten a lot better but has not had a fainted spell since the ED     HISTORY OF PRESENT ILLNESS: This is a 23 y.o. male here for follow-up of emergency department visit on 01/09/2024 when he presented with epigastric pain Responded well to GI cocktail. Asymptomatic today.  Has no complaints of medical concerns.  HPI   Prior to Admission medications   Medication Sig Start Date End Date Taking? Authorizing Provider  albuterol  (VENTOLIN  HFA) 108 (90 Base) MCG/ACT inhaler Inhale 2 puffs into the lungs every 6 (six) hours as needed for wheezing or shortness of breath. 07/22/21  Yes Henson, Vickie L, NP-C  famotidine  (PEPCID ) 20 MG tablet TAKE 1 TABLET(20 MG) BY MOUTH TWICE DAILY 08/18/21  Yes Henson, Vickie L, NP-C  famotidine  (PEPCID ) 20 MG tablet Take 1 tablet (20 mg total) by mouth 2 (two) times daily. 01/10/24  Yes Bauer, Collin S, PA-C  fluticasone  (FLONASE ) 50 MCG/ACT nasal spray SHAKE LIQUID AND USE 2 SPRAYS IN EACH NOSTRIL DAILY 08/18/21  Yes Henson, Vickie L, NP-C  pantoprazole  (PROTONIX ) 20 MG tablet Take 1 tablet (20 mg total) by mouth daily. 01/10/24  Yes Bauer, Collin S, PA-C  triamcinolone  cream (KENALOG ) 0.1 % Apply 1 application. topically 2 (two) times daily. Use for 10-14 days and stop. 07/22/21  Yes Henson, Vickie L, NP-C  cetirizine  (ZYRTEC ) 10 MG tablet Take 1 tablet (10 mg total) by mouth daily. Patient not taking: Reported on 01/17/2024 07/22/21   Lendia Boby CROME, NP-C    No Known Allergies  Patient Active Problem List   Diagnosis Date Noted   Gastroesophageal reflux disease 07/22/2021   Mild intermittent asthma without complication 07/22/2021   Obesity (BMI 30-39.9) 07/22/2021   Seasonal allergies 07/22/2021    Past Medical History:  Diagnosis Date   Asthma     History reviewed. No pertinent surgical history.  Social History   Socioeconomic History    Marital status: Single    Spouse name: Not on file   Number of children: Not on file   Years of education: Not on file   Highest education level: Not on file  Occupational History   Not on file  Tobacco Use   Smoking status: Never    Passive exposure: Yes   Smokeless tobacco: Never  Substance and Sexual Activity   Alcohol use: Not on file   Drug use: Not on file   Sexual activity: Not on file  Other Topics Concern   Not on file  Social History Narrative   Not on file   Social Drivers of Health   Financial Resource Strain: Not on file  Food Insecurity: Not on file  Transportation Needs: Not on file  Physical Activity: Not on file  Stress: Not on file  Social Connections: Not on file  Intimate Partner Violence: Not on file    History reviewed. No pertinent family history.   Review of Systems  Constitutional: Negative.  Negative for chills and fever.  HENT: Negative.  Negative for congestion and sore throat.   Respiratory: Negative.  Negative for cough and shortness of breath.   Cardiovascular: Negative.  Negative for chest pain and palpitations.  Gastrointestinal:  Negative for abdominal pain, diarrhea, nausea and vomiting.  Genitourinary: Negative.  Negative for dysuria and hematuria.  Skin: Negative.  Negative for rash.  Neurological:  Negative for dizziness and  headaches.  All other systems reviewed and are negative.   Vitals:   01/17/24 1409 01/17/24 1420  BP: (!) 120/100 (!) 120/100  Pulse: (!) 53   Temp: 97.7 F (36.5 C)   SpO2: 98%     Physical Exam Vitals reviewed.  Constitutional:      Appearance: Normal appearance.  HENT:     Head: Normocephalic.     Mouth/Throat:     Mouth: Mucous membranes are moist.     Pharynx: Oropharynx is clear.  Eyes:     Extraocular Movements: Extraocular movements intact.     Pupils: Pupils are equal, round, and reactive to light.  Cardiovascular:     Rate and Rhythm: Normal rate and regular rhythm.     Pulses:  Normal pulses.     Heart sounds: Normal heart sounds.  Pulmonary:     Effort: Pulmonary effort is normal.     Breath sounds: Normal breath sounds.  Abdominal:     Palpations: Abdomen is soft.     Tenderness: There is no abdominal tenderness.  Musculoskeletal:     Cervical back: No tenderness.  Lymphadenopathy:     Cervical: No cervical adenopathy.  Skin:    General: Skin is warm and dry.     Capillary Refill: Capillary refill takes less than 2 seconds.  Neurological:     General: No focal deficit present.     Mental Status: He is alert and oriented to person, place, and time.  Psychiatric:        Mood and Affect: Mood normal.        Behavior: Behavior normal.      ASSESSMENT & PLAN: Problem List Items Addressed This Visit       Other   Epigastric pain - Primary   Clinically stable.  Asymptomatic today Benign abdominal examination Recent blood work results reviewed with patient Leukocytosis of 15.7 Back to his regular diet and tolerating it well Advised to stay well-hydrated Diet and nutrition discussed ED precautions given Advised to contact the office if no better or worse during the next several days and weeks.      Other Visit Diagnoses       Rash       Relevant Medications   triamcinolone  cream (KENALOG ) 0.1 %      Patient Instructions  Health Maintenance, Male Adopting a healthy lifestyle and getting preventive care are important in promoting health and wellness. Ask your health care provider about: The right schedule for you to have regular tests and exams. Things you can do on your own to prevent diseases and keep yourself healthy. What should I know about diet, weight, and exercise? Eat a healthy diet  Eat a diet that includes plenty of vegetables, fruits, low-fat dairy products, and lean protein. Do not eat a lot of foods that are high in solid fats, added sugars, or sodium. Maintain a healthy weight Body mass index (BMI) is a measurement that  can be used to identify possible weight problems. It estimates body fat based on height and weight. Your health care provider can help determine your BMI and help you achieve or maintain a healthy weight. Get regular exercise Get regular exercise. This is one of the most important things you can do for your health. Most adults should: Exercise for at least 150 minutes each week. The exercise should increase your heart rate and make you sweat (moderate-intensity exercise). Do strengthening exercises at least twice a week. This is in addition to the  moderate-intensity exercise. Spend less time sitting. Even light physical activity can be beneficial. Watch cholesterol and blood lipids Have your blood tested for lipids and cholesterol at 22 years of age, then have this test every 5 years. You may need to have your cholesterol levels checked more often if: Your lipid or cholesterol levels are high. You are older than 22 years of age. You are at high risk for heart disease. What should I know about cancer screening? Many types of cancers can be detected early and may often be prevented. Depending on your health history and family history, you may need to have cancer screening at various ages. This may include screening for: Colorectal cancer. Prostate cancer. Skin cancer. Lung cancer. What should I know about heart disease, diabetes, and high blood pressure? Blood pressure and heart disease High blood pressure causes heart disease and increases the risk of stroke. This is more likely to develop in people who have high blood pressure readings or are overweight. Talk with your health care provider about your target blood pressure readings. Have your blood pressure checked: Every 3-5 years if you are 61-85 years of age. Every year if you are 27 years old or older. If you are between the ages of 54 and 40 and are a current or former smoker, ask your health care provider if you should have a one-time  screening for abdominal aortic aneurysm (AAA). Diabetes Have regular diabetes screenings. This checks your fasting blood sugar level. Have the screening done: Once every three years after age 7 if you are at a normal weight and have a low risk for diabetes. More often and at a younger age if you are overweight or have a high risk for diabetes. What should I know about preventing infection? Hepatitis B If you have a higher risk for hepatitis B, you should be screened for this virus. Talk with your health care provider to find out if you are at risk for hepatitis B infection. Hepatitis C Blood testing is recommended for: Everyone born from [birth year] through 1964. Anyone with known risk factors for hepatitis C. Sexually transmitted infections (STIs) You should be screened each year for STIs, including gonorrhea and chlamydia, if: You are sexually active and are younger than 22 years of age. You are older than 22 years of age and your health care provider tells you that you are at risk for this type of infection. Your sexual activity has changed since you were last screened, and you are at increased risk for chlamydia or gonorrhea. Ask your health care provider if you are at risk. Ask your health care provider about whether you are at high risk for HIV. Your health care provider may recommend a prescription medicine to help prevent HIV infection. If you choose to take medicine to prevent HIV, you should first get tested for HIV. You should then be tested every 3 months for as long as you are taking the medicine. Follow these instructions at home: Alcohol use Do not drink alcohol if your health care provider tells you not to drink. If you drink alcohol: Limit how much you have to 0-2 drinks a day. Know how much alcohol is in your drink. In the [place] one drink equals one 12 oz bottle of beer (355 mL), one 5 oz glass of wine (148 mL), or one 1 oz glass of hard liquor (44 mL). Lifestyle Do not use any  products that contain nicotine or tobacco. These products include cigarettes, chewing tobacco, and  vaping devices, such as e-cigarettes. If you need help quitting, ask your health care provider. Do not use street drugs. Do not share needles. Ask your health care provider for help if you need support or information about quitting drugs. General instructions Schedule regular health, dental, and eye exams. Stay current with your vaccines. Tell your health care provider if: You often feel depressed. You have ever been abused or do not feel safe at home. Summary Adopting a healthy lifestyle and getting preventive care are important in promoting health and wellness. Follow your health care provider's instructions about healthy diet, exercising, and getting tested or screened for diseases. Follow your health care provider's instructions on monitoring your cholesterol and blood pressure. This information is not intended to replace advice given to you by your health care provider. Make sure you discuss any questions you have with your health care provider. Document Revised: 08/30/2020 Document Reviewed: 08/30/2020 Elsevier Patient Education  2024 [employer]vier Inc.     Emil Schaumann, MD [facility]

## 2024-01-17 NOTE — Assessment & Plan Note (Signed)
 Clinically stable.  Asymptomatic today Benign abdominal examination Recent blood work results reviewed with patient Leukocytosis of 15.7 Back to his regular diet and tolerating it well Advised to stay well-hydrated Diet and nutrition discussed ED precautions given Advised to contact the office if no better or worse during the next several days and weeks.

## 2024-01-17 NOTE — Patient Instructions (Signed)
 Health Maintenance, Male  Adopting a healthy lifestyle and getting preventive care are important in promoting health and wellness. Ask your health care provider about:  The right schedule for you to have regular tests and exams.  Things you can do on your own to prevent diseases and keep yourself healthy.  What should I know about diet, weight, and exercise?  Eat a healthy diet    Eat a diet that includes plenty of vegetables, fruits, low-fat dairy products, and lean protein.  Do not eat a lot of foods that are high in solid fats, added sugars, or sodium.  Maintain a healthy weight  Body mass index (BMI) is a measurement that can be used to identify possible weight problems. It estimates body fat based on height and weight. Your health care provider can help determine your BMI and help you achieve or maintain a healthy weight.  Get regular exercise  Get regular exercise. This is one of the most important things you can do for your health. Most adults should:  Exercise for at least 150 minutes each week. The exercise should increase your heart rate and make you sweat (moderate-intensity exercise).  Do strengthening exercises at least twice a week. This is in addition to the moderate-intensity exercise.  Spend less time sitting. Even light physical activity can be beneficial.  Watch cholesterol and blood lipids  Have your blood tested for lipids and cholesterol at 22 years of age, then have this test every 5 years.  You may need to have your cholesterol levels checked more often if:  Your lipid or cholesterol levels are high.  You are older than 22 years of age.  You are at high risk for heart disease.  What should I know about cancer screening?  Many types of cancers can be detected early and may often be prevented. Depending on your health history and family history, you may need to have cancer screening at various ages. This may include screening for:  Colorectal cancer.  Prostate cancer.  Skin cancer.  Lung  cancer.  What should I know about heart disease, diabetes, and high blood pressure?  Blood pressure and heart disease  High blood pressure causes heart disease and increases the risk of stroke. This is more likely to develop in people who have high blood pressure readings or are overweight.  Talk with your health care provider about your target blood pressure readings.  Have your blood pressure checked:  Every 3-5 years if you are 22-52 years of age.  Every year if you are 3 years old or older.  If you are between the ages of 60 and 72 and are a current or former smoker, ask your health care provider if you should have a one-time screening for abdominal aortic aneurysm (AAA).  Diabetes  Have regular diabetes screenings. This checks your fasting blood sugar level. Have the screening done:  Once every three years after age 66 if you are at a normal weight and have a low risk for diabetes.  More often and at a younger age if you are overweight or have a high risk for diabetes.  What should I know about preventing infection?  Hepatitis B  If you have a higher risk for hepatitis B, you should be screened for this virus. Talk with your health care provider to find out if you are at risk for hepatitis B infection.  Hepatitis C  Blood testing is recommended for:  Everyone born from [birth year] through 1964.  Anyone  with known risk factors for hepatitis C.  Sexually transmitted infections (STIs)  You should be screened each year for STIs, including gonorrhea and chlamydia, if:  You are sexually active and are younger than 22 years of age.  You are older than 22 years of age and your health care provider tells you that you are at risk for this type of infection.  Your sexual activity has changed since you were last screened, and you are at increased risk for chlamydia or gonorrhea. Ask your health care provider if you are at risk.  Ask your health care provider about whether you are at high risk for HIV. Your health care provider  may recommend a prescription medicine to help prevent HIV infection. If you choose to take medicine to prevent HIV, you should first get tested for HIV. You should then be tested every 3 months for as long as you are taking the medicine.  Follow these instructions at home:  Alcohol use  Do not drink alcohol if your health care provider tells you not to drink.  If you drink alcohol:  Limit how much you have to 0-2 drinks a day.  Know how much alcohol is in your drink. In the [place] one drink equals one 12 oz bottle of beer (355 mL), one 5 oz glass of wine (148 mL), or one 1 oz glass of hard liquor (44 mL).  Lifestyle  Do not use any products that contain nicotine or tobacco. These products include cigarettes, chewing tobacco, and vaping devices, such as e-cigarettes. If you need help quitting, ask your health care provider.  Do not use street drugs.  Do not share needles.  Ask your health care provider for help if you need support or information about quitting drugs.  General instructions  Schedule regular health, dental, and eye exams.  Stay current with your vaccines.  Tell your health care provider if:  You often feel depressed.  You have ever been abused or do not feel safe at home.  Summary  Adopting a healthy lifestyle and getting preventive care are important in promoting health and wellness.  Follow your health care provider's instructions about healthy diet, exercising, and getting tested or screened for diseases.  Follow your health care provider's instructions on monitoring your cholesterol and blood pressure.  This information is not intended to replace advice given to you by your health care provider. Make sure you discuss any questions you have with your health care provider.  Document Revised: 08/30/2020 Document Reviewed: 08/30/2020  [employer]vier Patient Education  2024 [employer].
